# Patient Record
Sex: Male | Born: 1980 | Race: Black or African American | Hispanic: No | Marital: Married | Smoking: Current some day smoker
Health system: Southern US, Community
[De-identification: ages and names within clinical notes are randomized; demographics above are authoritative.]

## PROBLEM LIST (undated history)

## (undated) DIAGNOSIS — F329 Major depressive disorder, single episode, unspecified: Secondary | ICD-10-CM

## (undated) DIAGNOSIS — C717 Malignant neoplasm of brain stem: Secondary | ICD-10-CM

## (undated) DIAGNOSIS — F32A Depression, unspecified: Secondary | ICD-10-CM

## (undated) DIAGNOSIS — W3400XA Accidental discharge from unspecified firearms or gun, initial encounter: Secondary | ICD-10-CM

## (undated) HISTORY — PX: COLON SURGERY: SHX602

---
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
## 2000-01-05 ENCOUNTER — Inpatient Hospital Stay (HOSPITAL_COMMUNITY): Admission: EM | Admit: 2000-01-05 | Discharge: 2000-01-09 | Payer: Self-pay | Admitting: *Deleted

## 2000-09-03 ENCOUNTER — Emergency Department (HOSPITAL_COMMUNITY): Admission: EM | Admit: 2000-09-03 | Discharge: 2000-09-03 | Payer: Self-pay | Admitting: Emergency Medicine

## 2000-09-03 ENCOUNTER — Encounter: Payer: Self-pay | Admitting: Emergency Medicine

## 2001-12-06 ENCOUNTER — Encounter (INDEPENDENT_AMBULATORY_CARE_PROVIDER_SITE_OTHER): Payer: Self-pay

## 2001-12-06 ENCOUNTER — Encounter: Payer: Self-pay | Admitting: General Surgery

## 2001-12-06 ENCOUNTER — Inpatient Hospital Stay (HOSPITAL_COMMUNITY): Admission: AC | Admit: 2001-12-06 | Discharge: 2001-12-30 | Payer: Self-pay

## 2001-12-08 ENCOUNTER — Encounter: Payer: Self-pay | Admitting: General Surgery

## 2001-12-10 ENCOUNTER — Encounter: Payer: Self-pay | Admitting: Surgery

## 2001-12-11 ENCOUNTER — Encounter: Payer: Self-pay | Admitting: General Surgery

## 2001-12-12 ENCOUNTER — Encounter: Payer: Self-pay | Admitting: General Surgery

## 2001-12-15 ENCOUNTER — Encounter: Payer: Self-pay | Admitting: General Surgery

## 2001-12-15 ENCOUNTER — Encounter: Payer: Self-pay | Admitting: Surgery

## 2001-12-18 ENCOUNTER — Encounter: Payer: Self-pay | Admitting: General Surgery

## 2001-12-23 ENCOUNTER — Encounter: Payer: Self-pay | Admitting: General Surgery

## 2001-12-26 ENCOUNTER — Encounter: Payer: Self-pay | Admitting: General Surgery

## 2002-01-04 ENCOUNTER — Encounter: Payer: Self-pay | Admitting: Emergency Medicine

## 2002-01-04 ENCOUNTER — Emergency Department (HOSPITAL_COMMUNITY): Admission: EM | Admit: 2002-01-04 | Discharge: 2002-01-05 | Payer: Self-pay | Admitting: Emergency Medicine

## 2002-01-09 ENCOUNTER — Inpatient Hospital Stay (HOSPITAL_COMMUNITY): Admission: EM | Admit: 2002-01-09 | Discharge: 2002-01-12 | Payer: Self-pay

## 2002-01-10 ENCOUNTER — Encounter: Payer: Self-pay | Admitting: Surgery

## 2002-01-11 ENCOUNTER — Encounter: Payer: Self-pay | Admitting: Surgery

## 2002-01-13 ENCOUNTER — Encounter: Payer: Self-pay | Admitting: Emergency Medicine

## 2002-01-13 ENCOUNTER — Emergency Department (HOSPITAL_COMMUNITY): Admission: EM | Admit: 2002-01-13 | Discharge: 2002-01-13 | Payer: Self-pay | Admitting: Emergency Medicine

## 2002-02-10 ENCOUNTER — Emergency Department (HOSPITAL_COMMUNITY): Admission: EM | Admit: 2002-02-10 | Discharge: 2002-02-11 | Payer: Self-pay

## 2002-02-17 ENCOUNTER — Emergency Department (HOSPITAL_COMMUNITY): Admission: EM | Admit: 2002-02-17 | Discharge: 2002-02-17 | Payer: Self-pay | Admitting: Emergency Medicine

## 2002-04-12 ENCOUNTER — Encounter: Payer: Self-pay | Admitting: General Surgery

## 2002-04-14 ENCOUNTER — Inpatient Hospital Stay (HOSPITAL_COMMUNITY): Admission: RE | Admit: 2002-04-14 | Discharge: 2002-04-21 | Payer: Self-pay | Admitting: General Surgery

## 2002-04-14 ENCOUNTER — Encounter (INDEPENDENT_AMBULATORY_CARE_PROVIDER_SITE_OTHER): Payer: Self-pay | Admitting: *Deleted

## 2002-04-23 ENCOUNTER — Emergency Department (HOSPITAL_COMMUNITY): Admission: EM | Admit: 2002-04-23 | Discharge: 2002-04-23 | Payer: Self-pay | Admitting: Emergency Medicine
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
## 2004-03-18 ENCOUNTER — Emergency Department (HOSPITAL_COMMUNITY): Admission: EM | Admit: 2004-03-18 | Discharge: 2004-03-18 | Payer: Self-pay

## 2004-10-06 ENCOUNTER — Emergency Department (HOSPITAL_COMMUNITY): Admission: EM | Admit: 2004-10-06 | Discharge: 2004-10-07 | Payer: Self-pay | Admitting: *Deleted

## 2005-04-02 ENCOUNTER — Emergency Department (HOSPITAL_COMMUNITY): Admission: EM | Admit: 2005-04-02 | Discharge: 2005-04-02 | Payer: Self-pay | Admitting: Emergency Medicine

## 2005-07-28 ENCOUNTER — Emergency Department (HOSPITAL_COMMUNITY): Admission: EM | Admit: 2005-07-28 | Discharge: 2005-07-28 | Payer: Self-pay | Admitting: Emergency Medicine

## 2005-07-29 ENCOUNTER — Emergency Department (HOSPITAL_COMMUNITY): Admission: EM | Admit: 2005-07-29 | Discharge: 2005-07-29 | Payer: Self-pay | Admitting: Emergency Medicine

## 2006-07-01 ENCOUNTER — Emergency Department (HOSPITAL_COMMUNITY): Admission: EM | Admit: 2006-07-01 | Discharge: 2006-07-01 | Payer: Self-pay | Admitting: Emergency Medicine

## 2008-01-17 ENCOUNTER — Emergency Department (HOSPITAL_COMMUNITY): Admission: EM | Admit: 2008-01-17 | Discharge: 2008-01-18 | Payer: Self-pay | Admitting: Emergency Medicine

## 2008-01-18 ENCOUNTER — Inpatient Hospital Stay (HOSPITAL_COMMUNITY): Admission: EM | Admit: 2008-01-18 | Discharge: 2008-01-19 | Payer: Self-pay | Admitting: Psychiatry

## 2008-01-23 ENCOUNTER — Ambulatory Visit: Payer: Self-pay | Admitting: Psychiatry

## 2008-04-03 ENCOUNTER — Emergency Department (HOSPITAL_COMMUNITY): Admission: EM | Admit: 2008-04-03 | Discharge: 2008-04-03 | Payer: Self-pay | Admitting: Emergency Medicine

## 2009-02-20 ENCOUNTER — Emergency Department (HOSPITAL_COMMUNITY): Admission: EM | Admit: 2009-02-20 | Discharge: 2009-02-20 | Payer: Self-pay | Admitting: Emergency Medicine
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
## 2011-03-13 NOTE — Op Note (Signed)
[name]. [facility]  Patient:    Jorge Henderson, Jorge Henderson Visit Number: [id number] MRN: [id number]          Service Type: SUR Location: 5700 5703 01 Attending Physician:  Cherylynn Ridges Dictated by:   Jimmye Norman, M.D. Proc. Date: 04/15/02 Admit Date:  04/14/2002                             Operative Report  PREOPERATIVE DIAGNOSIS:  Hemorrhage from colostomy site.  POSTOPERATIVE DIAGNOSIS:  Hemorrhage from colostomy site.  OPERATION PERFORMED:  Re-exploration and re-excision of colostomy site with repair and control of hemorrhage.  SURGEON:  Jimmye Norman, M.D.  ASSISTANT:  None.  ANESTHESIA:  General endotracheal.  ESTIMATED BLOOD LOSS:  Less than 10 to 20 cc.  COMPLICATIONS:  None.  CONDITION:  Stable.  INDICATIONS FOR PROCEDURE:  The patient is a 30 year old male who underwent a take-down colostomy yesterday who had continued to bleed large amounts of clotted blood from his colostomy site.  Because of continued hemorrhage, he was taken to the operating room for evacuation, repair and expression of wound.  OPERATIVE FINDINGS:  The patient had diffuse oozing throughout the colostomy site; however, it was noted that the skin edges were not well apposed and there was bleeding from the edges.  There was no definitive arterial bleeder from this site at all.  DESCRIPTION OF PROCEDURE:  The patient was taken to the operating room and placed on the table on the supine position.  After an adequate endotracheal anesthetic was administered, he was prepped and draped in the usual sterile manner exposing the midline.  The colostomy site was the one that was bleeding.  The staples were removed using a hemostat clamp exposing the subcutaneous which had a large amount of clot within it.  The clot was evacuated.  The wound was irrigated and multiple sort of diffuse sites of bleeding were noted which were cauterized.  No arterial bleeding was noted  initially.  We re-excised the wound making an oval incision around the previous colostomy site which was circular, elongating it approximately 50%.  We subsequently controlled the bleeding in the subcutaneous with electrocautery.  We then closed in two layers.  We did reinforce the fascial layer with three interrupted figure-of-eight stitches of 4-0 Vicryl.  The subcutaneous was then reapproximated using a running 2-0 undyed Vicryl suture.  The skin was closed using stainless steel staples.  There was minimal bleeding during this point and we closed with stainless steel staples on the skin. Dictated by:   Jimmye Norman, M.D. Attending Physician:  Cherylynn Ridges DD:  04/15/02 TD:  04/17/02 Job: [id number]
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
## 2011-07-20 LAB — BASIC METABOLIC PANEL
BUN: 14
Calcium: 9.5
Creatinine, Ser: 1.01
GFR calc non Af Amer: >60
Potassium: 3.4 — ABNORMAL LOW

## 2011-07-20 LAB — URINALYSIS, ROUTINE W REFLEX MICROSCOPIC
Ketones, ur: NEGATIVE
Nitrite: NEGATIVE
Protein, ur: NEGATIVE
Specific Gravity, Urine: 1.023

## 2011-07-20 LAB — HEPATIC FUNCTION PANEL
ALT: 13
ALT: 15
Alkaline Phosphatase: 114
Alkaline Phosphatase: 119 — ABNORMAL HIGH
Bilirubin, Direct: <0.1
Indirect Bilirubin: 0.6
Total Bilirubin: 0.6
Total Bilirubin: 0.7
Total Protein: 6.3

## 2011-07-20 LAB — RAPID URINE DRUG SCREEN, HOSP PERFORMED
Amphetamines: NOT DETECTED
Barbiturates: NOT DETECTED
Benzodiazepines: NOT DETECTED
Cocaine: NOT DETECTED
Opiates: NOT DETECTED

## 2011-07-20 LAB — ETHANOL: Alcohol, Ethyl (B): <5

## 2011-07-20 LAB — CBC
HCT: 43.3
MCV: 93.1
Platelets: 201
RBC: 4.65
WBC: 5.9

## 2011-07-20 LAB — DIFFERENTIAL
Eosinophils Absolute: 0
Neutrophils Relative %: 39 — ABNORMAL LOW

## 2011-07-20 LAB — ACETAMINOPHEN LEVEL
Acetaminophen (Tylenol), Serum: 13.2
Acetaminophen (Tylenol), Serum: 34 — ABNORMAL HIGH

## 2011-07-20 LAB — SALICYLATE LEVEL: Salicylate Lvl: 14.8

## 2011-07-20 LAB — TSH: TSH: 0.901

## 2011-07-23 LAB — POCT I-STAT, CHEM 8
Chloride: 106
Glucose, Bld: 86
HCT: 43
Potassium: 3.3 — ABNORMAL LOW

## 2013-07-06 ENCOUNTER — Emergency Department (HOSPITAL_COMMUNITY)
Admission: EM | Admit: 2013-07-06 | Discharge: 2013-07-06 | Disposition: A | Discharge status: Home / Self Care | Payer: Self-pay | Attending: Emergency Medicine | Admitting: Emergency Medicine

## 2013-07-06 ENCOUNTER — Emergency Department (HOSPITAL_COMMUNITY): Payer: Self-pay

## 2013-07-06 ENCOUNTER — Encounter (HOSPITAL_COMMUNITY): Payer: Self-pay | Admitting: Emergency Medicine

## 2013-07-06 DIAGNOSIS — R209 Unspecified disturbances of skin sensation: Secondary | ICD-10-CM | POA: Insufficient documentation

## 2013-07-06 DIAGNOSIS — Y9383 Activity, rough housing and horseplay: Secondary | ICD-10-CM | POA: Insufficient documentation

## 2013-07-06 DIAGNOSIS — F17200 Nicotine dependence, unspecified, uncomplicated: Secondary | ICD-10-CM | POA: Insufficient documentation

## 2013-07-06 DIAGNOSIS — Z85841 Personal history of malignant neoplasm of brain: Secondary | ICD-10-CM | POA: Insufficient documentation

## 2013-07-06 DIAGNOSIS — X500XXA Overexertion from strenuous movement or load, initial encounter: Secondary | ICD-10-CM | POA: Insufficient documentation

## 2013-07-06 DIAGNOSIS — S82899A Other fracture of unspecified lower leg, initial encounter for closed fracture: Secondary | ICD-10-CM | POA: Insufficient documentation

## 2013-07-06 DIAGNOSIS — Y9289 Other specified places as the place of occurrence of the external cause: Secondary | ICD-10-CM | POA: Insufficient documentation

## 2013-07-06 DIAGNOSIS — S82401A Unspecified fracture of shaft of right fibula, initial encounter for closed fracture: Secondary | ICD-10-CM

## 2013-07-06 HISTORY — DX: Malignant neoplasm of brain stem: C71.7

## 2013-07-06 MED ORDER — IBUPROFEN 800 MG PO TABS
800.0000 mg | ORAL_TABLET | Freq: Once | ORAL | Status: AC
  Administered 2013-07-06: 800 mg via ORAL
  Filled 2013-07-06: qty 1

## 2013-07-06 NOTE — Progress Notes (Signed)
[facility] provided pt with a [facility] orange card application.

## 2013-07-06 NOTE — ED Notes (Signed)
Per pt, states he twisted ankle last Monday-still having pain
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
## 2013-07-09 NOTE — ED Provider Notes (Signed)
Medical screening examination/treatment/procedure(s) were performed by non-physician practitioner and as supervising physician I was immediately available for consultation/collaboration.  Layla Maw Jadier Rockers, DO 07/09/13 1400

## 2015-09-12 ENCOUNTER — Emergency Department (HOSPITAL_COMMUNITY): Payer: Self-pay

## 2015-09-12 ENCOUNTER — Encounter (HOSPITAL_COMMUNITY): Payer: Self-pay | Admitting: *Deleted

## 2015-09-12 ENCOUNTER — Emergency Department (HOSPITAL_COMMUNITY)
Admission: EM | Admit: 2015-09-12 | Discharge: 2015-09-13 | Disposition: A | Discharge status: Other Acute Inpatient Hospital | Payer: Self-pay | Attending: Emergency Medicine | Admitting: Emergency Medicine

## 2015-09-12 DIAGNOSIS — F1110 Opioid abuse, uncomplicated: Secondary | ICD-10-CM | POA: Insufficient documentation

## 2015-09-12 DIAGNOSIS — Y998 Other external cause status: Secondary | ICD-10-CM | POA: Insufficient documentation

## 2015-09-12 DIAGNOSIS — F332 Major depressive disorder, recurrent severe without psychotic features: Secondary | ICD-10-CM | POA: Diagnosis present

## 2015-09-12 DIAGNOSIS — F17210 Nicotine dependence, cigarettes, uncomplicated: Secondary | ICD-10-CM | POA: Insufficient documentation

## 2015-09-12 DIAGNOSIS — Z85841 Personal history of malignant neoplasm of brain: Secondary | ICD-10-CM | POA: Insufficient documentation

## 2015-09-12 DIAGNOSIS — R0682 Tachypnea, not elsewhere classified: Secondary | ICD-10-CM | POA: Insufficient documentation

## 2015-09-12 DIAGNOSIS — Y9389 Activity, other specified: Secondary | ICD-10-CM | POA: Insufficient documentation

## 2015-09-12 DIAGNOSIS — T402X2A Poisoning by other opioids, intentional self-harm, initial encounter: Secondary | ICD-10-CM | POA: Insufficient documentation

## 2015-09-12 DIAGNOSIS — T391X2A Poisoning by 4-Aminophenol derivatives, intentional self-harm, initial encounter: Secondary | ICD-10-CM | POA: Insufficient documentation

## 2015-09-12 DIAGNOSIS — Y9289 Other specified places as the place of occurrence of the external cause: Secondary | ICD-10-CM | POA: Insufficient documentation

## 2015-09-12 DIAGNOSIS — F1210 Cannabis abuse, uncomplicated: Secondary | ICD-10-CM | POA: Insufficient documentation

## 2015-09-12 HISTORY — DX: Depression, unspecified: F32.A

## 2015-09-12 HISTORY — DX: Major depressive disorder, single episode, unspecified: F32.9

## 2015-09-12 LAB — RAPID URINE DRUG SCREEN, HOSP PERFORMED
Amphetamines: NOT DETECTED
BARBITURATES: NOT DETECTED
Benzodiazepines: NOT DETECTED
COCAINE: NOT DETECTED
OPIATES: POSITIVE — AB
Tetrahydrocannabinol: POSITIVE — AB

## 2015-09-12 LAB — ETHANOL: Alcohol, Ethyl (B): <5 mg/dL (ref <5)

## 2015-09-12 LAB — CBC WITH DIFFERENTIAL/PLATELET
BASOS PCT: 0 %
Basophils Absolute: 0 10*3/uL (ref 0.0–0.1)
EOS ABS: 0 10*3/uL (ref 0.0–0.7)
EOS PCT: 0 %
HCT: 43.2 % (ref 39.0–52.0)
Hemoglobin: 15.2 g/dL (ref 13.0–17.0)
Lymphocytes Relative: 33 %
Lymphs Abs: 2.7 10*3/uL (ref 0.7–4.0)
MCH: 32.1 pg (ref 26.0–34.0)
MCHC: 35.2 g/dL (ref 30.0–36.0)
MCV: 91.3 fL (ref 78.0–100.0)
MONO ABS: 0.4 10*3/uL (ref 0.1–1.0)
MONOS PCT: 5 %
Neutro Abs: 4.9 10*3/uL (ref 1.7–7.7)
Neutrophils Relative %: 62 %
PLATELETS: 200 10*3/uL (ref 150–400)
RBC: 4.73 MIL/uL (ref 4.22–5.81)
RDW: 12.3 % (ref 11.5–15.5)
WBC: 8.1 10*3/uL (ref 4.0–10.5)

## 2015-09-12 LAB — COMPREHENSIVE METABOLIC PANEL
ALBUMIN: 4.8 g/dL (ref 3.5–5.0)
ALK PHOS: 116 U/L (ref 38–126)
ALT: 20 U/L (ref 17–63)
AST: 22 U/L (ref 15–41)
Anion gap: 13 (ref 5–15)
BILIRUBIN TOTAL: 0.5 mg/dL (ref 0.3–1.2)
BUN: 17 mg/dL (ref 6–20)
CALCIUM: 9.2 mg/dL (ref 8.9–10.3)
CO2: 20 mmol/L — ABNORMAL LOW (ref 22–32)
Chloride: 105 mmol/L (ref 101–111)
Creatinine, Ser: 1.02 mg/dL (ref 0.61–1.24)
GFR calc Af Amer: >60 mL/min (ref >60)
GFR calc non Af Amer: >60 mL/min (ref >60)
GLUCOSE: 130 mg/dL — AB (ref 65–99)
Potassium: 3.4 mmol/L — ABNORMAL LOW (ref 3.5–5.1)
Sodium: 138 mmol/L (ref 135–145)
TOTAL PROTEIN: 7.2 g/dL (ref 6.5–8.1)

## 2015-09-12 LAB — I-STAT CHEM 8, ED
BUN: 16 mg/dL (ref 6–20)
CHLORIDE: 102 mmol/L (ref 101–111)
CREATININE: 1 mg/dL (ref 0.61–1.24)
Calcium, Ion: 1.16 mmol/L (ref 1.12–1.23)
GLUCOSE: 132 mg/dL — AB (ref 65–99)
HCT: 48 % (ref 39.0–52.0)
Hemoglobin: 16.3 g/dL (ref 13.0–17.0)
POTASSIUM: 3.4 mmol/L — AB (ref 3.5–5.1)
Sodium: 140 mmol/L (ref 135–145)
TCO2: 21 mmol/L (ref 0–100)

## 2015-09-12 LAB — ACETAMINOPHEN LEVEL
ACETAMINOPHEN (TYLENOL), SERUM: 27 ug/mL (ref 10–30)
Acetaminophen (Tylenol), Serum: 11 ug/mL (ref 10–30)

## 2015-09-12 LAB — SALICYLATE LEVEL: Salicylate Lvl: <4 mg/dL (ref 2.8–30.0)

## 2015-09-12 LAB — CBG MONITORING, ED: Glucose-Capillary: 127 mg/dL — ABNORMAL HIGH (ref 65–99)
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
## 2015-09-12 NOTE — ED Notes (Signed)
Poison control called, spoke with Corene Cornea, draw a tylenol level at 1630. Symptomatic and supportive care.  Fluids and antiemetics supportively if needed. Called poison control is 4 hour post tylenol level is >140mcg/ml.

## 2015-09-12 NOTE — ED Provider Notes (Signed)
Patient is medically clear. Second Tylenol level was 11. TTS consultation  Blanchie Dessert, MD 09/12/15 [phone]

## 2015-09-12 NOTE — ED Notes (Addendum)
Pt reports he has an allergy to tylenol,  pt has been intubated in the past due to tylenol allergy. Pt currently has brain cancer, received radiation last week. Pt has had SI thoughts x1 month. Pt took at least 30 tablets of vicodin (wifes medication) in attempt to kill himself. Pt took medications around 1230, then drove to pick up his wife. Wife found him and called ems.  Ems started 18 G L AC, 4 mg zofran given.   At present pt reports his throat feels scratchy and he is nauseas with abdominal pain 8/10. Pt vomited 71ml upon arrival to ED.   Pt has previous SI attempt a few years ago by cutting himself.   Pt and wife both separately report he receives radiation from [facility] on [address].
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
## 2015-09-12 NOTE — ED Notes (Addendum)
Pt presents with SI attempt, overdose of 30 Vicodin tabs.  Pt cleared by [employer].  AAO x 3, no distress noted at present.  Family at bedside at present. Monitoring for safety, Q 15 min checks in effect.  Denies HI or AV hallucinations, feeling hopeless.

## 2015-09-12 NOTE — Progress Notes (Signed)
CM spoke with pt who confirms uninsured [place] resident with no pcp.  CM discussed and provided written information for uninsured accepting pcps, discussed the importance of pcp vs EDP services for f/u care, www.needymeds.org, www.goodrx.com, discounted pharmacies and other [place] resources such as [facility] , [facility], affordable care act, financial assistance, uninsured dental services, [place] med assist, DSS and  health department  Reviewed resources for [place] uninsured accepting pcps like [facility], [facility] [address], [facility], [facility], local urgent care centers, [facility], [facility], general medical clinics, [facility], [facility] plus others, medication resources, [facility] out patient [facility]ies and housing Pt voiced understanding and appreciation of resources provided   Provided [facility] contact information Pt agreed to a referral Cm completed referral Pt to be contact by [facility] clinical liason  

## 2015-09-12 NOTE — ED Notes (Signed)
Sitter at bedside.

## 2015-09-12 NOTE — ED Notes (Signed)
Pt c/o chest pain discomfort increasing w/ inspiration.  Mesner MD made aware.

## 2015-09-12 NOTE — ED Notes (Signed)
md at bedside

## 2015-09-12 NOTE — ED Notes (Signed)
Bed: RESB Expected date:  Expected time:  Means of arrival:  Comments: Brain cancer, OD on 30 hydrocodone
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
## 2015-09-12 NOTE — ED Notes (Addendum)
Pt is medically cleared. Pt upset that he has to be changed into paper scrubs and his belongings taken away. Pt stated the "doctor did not talk about this when he first came in". rn explained that pt is medically cleared and now a behavioral patient and this is the protocol. And that the doctor did say "cooperate with [place] or you will be involuntarily committed".   Pt reports he gets radiation from [facility] on [facility] and Dr Elba Barman is his doctor. RN asked about radiation location again, and pt and wife both stated this again. 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 Pt reported this history in previous visit to ED in 2014. Pt stated "I don't want to talk about my cancer stuff".   Pt stated that he took 30 hydrocodone/vicodin, rn explained that tylenol level was not elevated. pt stated he did not take tylenol, rn explained that vicodin has "tylenol/acetometophin" in it, and that poison control stated earlier that the "expected response if the patient took 30 tablets of vicodin would be that the pt would have anaphylaxis or be a coma".  Pt alert and oriented x4.   Per plunkett, if pt tries to leave he will be IVC.

## 2015-09-13 ENCOUNTER — Inpatient Hospital Stay (HOSPITAL_COMMUNITY)
Admission: AD | Admit: 2015-09-13 | Discharge: 2015-09-16 | DRG: 882 | Disposition: A | Discharge status: Home / Self Care | Payer: Federal, State, Local not specified - Other | Source: Intra-hospital | Attending: Psychiatry | Admitting: Psychiatry

## 2015-09-13 ENCOUNTER — Encounter (HOSPITAL_COMMUNITY): Payer: Self-pay | Admitting: *Deleted

## 2015-09-13 DIAGNOSIS — F4323 Adjustment disorder with mixed anxiety and depressed mood: Principal | ICD-10-CM | POA: Clinically undetermined

## 2015-09-13 DIAGNOSIS — Z85841 Personal history of malignant neoplasm of brain: Secondary | ICD-10-CM

## 2015-09-13 DIAGNOSIS — T1491 Suicide attempt: Secondary | ICD-10-CM | POA: Diagnosis not present

## 2015-09-13 DIAGNOSIS — T50901A Poisoning by unspecified drugs, medicaments and biological substances, accidental (unintentional), initial encounter: Secondary | ICD-10-CM

## 2015-09-13 DIAGNOSIS — R45851 Suicidal ideations: Secondary | ICD-10-CM | POA: Diagnosis present

## 2015-09-13 DIAGNOSIS — F332 Major depressive disorder, recurrent severe without psychotic features: Secondary | ICD-10-CM | POA: Diagnosis present

## 2015-09-13 DIAGNOSIS — T391X2A Poisoning by 4-Aminophenol derivatives, intentional self-harm, initial encounter: Secondary | ICD-10-CM

## 2015-09-13 DIAGNOSIS — F4310 Post-traumatic stress disorder, unspecified: Secondary | ICD-10-CM | POA: Diagnosis present

## 2015-09-13 DIAGNOSIS — F17210 Nicotine dependence, cigarettes, uncomplicated: Secondary | ICD-10-CM | POA: Diagnosis present

## 2015-09-13 MED ORDER — ZOLPIDEM TARTRATE 5 MG PO TABS
5.0000 mg | ORAL_TABLET | Freq: Every evening | ORAL | Status: DC | PRN
  Administered 2015-09-13 – 2015-09-15 (×3): 5 mg via ORAL
  Filled 2015-09-13 (×3): qty 1

## 2015-09-13 MED ORDER — MAGNESIUM HYDROXIDE 400 MG/5ML PO SUSP: 30.0000 mL | Freq: Every day | ORAL | Status: DC | PRN

## 2015-09-13 MED ORDER — HYDROXYZINE HCL 25 MG PO TABS: 25.0000 mg | ORAL_TABLET | Freq: Three times a day (TID) | ORAL | Status: DC | PRN

## 2015-09-13 MED ORDER — ALUM & MAG HYDROXIDE-SIMETH 200-200-20 MG/5ML PO SUSP
30.0000 mL | ORAL | Status: DC | PRN
Start: 2015-09-13 — End: 2015-09-16

## 2015-09-13 NOTE — Tx Team (Signed)
Initial Interdisciplinary Treatment Plan   PATIENT STRESSORS: Financial difficulties Occupational concerns Substance abuse   PATIENT STRENGTHS: Ability for insight Average or above average intelligence Capable of independent living Communication skills Motivation for treatment/growth Supportive family/friends   PROBLEM LIST: Problem List/Patient Goals Date to be addressed Date deferred Reason deferred Estimated date of resolution  "I want to feel better about myself again, open up & be  Confident". 09-13-15     Alteration in mood (Depression & Anxiety) 09-13-15     Substance Abuse (THC & Opiods) 09-13-15                                          DISCHARGE CRITERIA:  Improved stabilization in mood, thinking, and/or behavior Motivation to continue treatment in a less acute level of care Reduction of life-threatening or endangering symptoms to within safe limits Verbal commitment to aftercare and medication compliance Withdrawal symptoms are absent or subacute and managed without 24-hour nursing intervention  PRELIMINARY DISCHARGE PLAN: Outpatient therapy Return to previous living arrangement  PATIENT/FAMIILY INVOLVEMENT: This treatment plan has been presented to and reviewed with the patient, Jorge Henderson.  The patient have been given the opportunity to ask questions and make suggestions.  Keane Police 09/13/2015, 2:42 PM

## 2015-09-13 NOTE — BH Assessment (Signed)
[facility] Assessment Progress Note  Per Corena Pilgrim, MD, this pt requires psychiatric hospitalization at this time.  Per shift report pt has been assigned pt to Rm [id number].  Pt has signed Voluntary Admission and Consent for Treatment, as well as Consent to Release Information to his wife and his mother, and signed forms have been faxed to [facility].  Pt's nurse, Otho Perl, has been notified, and agrees to send original paperwork along with pt via [facility], and to call report to [phone].  Jalene Mullet, [place] Triage Specialist [phone]
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
## 2015-09-13 NOTE — [facility] Group Notes (Signed)
Adult Psychoeducational Group Note  Date:  09/13/2015 Time:  9:53 PM  Group Topic/Focus:  Wrap-Up Group:   The focus of this group is to help patients review their daily goal of treatment and discuss progress on daily workbooks.  Participation Level:  Active  Participation Quality:  Appropriate  Affect:  Appropriate  Cognitive:  Appropriate  Insight: Good  Engagement in Group:  Engaged  Modes of Intervention:  Discussion  Additional Comments:  Patient is a new admit.  He stated he wants to go home and that the day has been pretty good.  He also expressed that he likes the people and was glad to meet others who were going through the same things he's going through and he was glad to be around others in general.  Jorge Henderson A 09/13/2015, 9:53 PM

## 2015-09-13 NOTE — ED Notes (Signed)
Informed extender that patient and family are requesting a provider consult.
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
## 2015-09-13 NOTE — BHH Suicide Risk Assessment (Signed)
[facility] Admission Suicide Risk Assessment   Nursing information obtained from:   patient and chart  Demographic factors:   34 year old married male, lives with wife  Current Mental Status:   see below Loss Factors:   marital distress, history of cancer, unemployment  Historical Factors:   history of PTSD in the past, now improved Risk Reduction Factors:   resilience, social support  Total Time spent with patient: 45 minutes Principal Problem: Suicidal attempt by overdose Diagnosis:   Patient Active Problem List   Diagnosis Date Noted  . Recurrent major depression-severe ([place]) [F33.2] 09/13/2015  . Major depressive disorder, recurrent episode, severe ([place]) [F33.2] 09/13/2015     Continued Clinical Symptoms:  Alcohol Use Disorder Identification Test Final Score (AUDIT): 2 The "Alcohol Use Disorders Identification Test", Guidelines for Use in Primary Care, Second Edition.  World Pharmacologist [facility]). Score between 0-7:  no or low risk or alcohol related problems. Score between 8-15:  moderate risk of alcohol related problems. Score between 16-19:  high risk of alcohol related problems. Score 20 or above:  warrants further diagnostic evaluation for alcohol dependence and treatment.   CLINICAL FACTORS:  34 year old married male, reports impulsive overdose on vicodin ( complicated by fact that he is allergic to acetaminophen ) yesterday, related to distress in telling his wife he had been unfaithful. Minimizes / denies depression prior to his overdose. Today denies depression , neurovegetative symptoms of depression or any lingering suicidal ideations.    Psychiatric Specialty Exam: Physical Exam  ROS  Blood pressure 140/70, pulse 91, temperature 98.3 F (36.8 C), temperature source Oral, resp. rate 16, height 5\' 9"  (1.753 m), weight 174 lb (78.926 kg), SpO2 100 %.Body mass index is 25.68 kg/(m^2).   see admit note MSE   COGNITIVE FEATURES THAT CONTRIBUTE TO RISK:  Loss of  executive function    SUICIDE RISK:   Moderate:  Frequent suicidal ideation with limited intensity, and duration, some specificity in terms of plans, no associated intent, good self-control, limited dysphoria/symptomatology, some risk factors present, and identifiable protective factors, including available and accessible social support.  PLAN OF CARE: Patient will be admitted to inpatient psychiatric unit for stabilization and safety. Will provide and encourage milieu participation. Provide medication management and maked adjustments as needed.  Will follow daily.    Medical Decision Making:  Established Problem, Stable/Improving (1), Review of Psycho-Social Stressors (1), Review or order clinical lab tests (1) and Review of Medication Regimen & Side Effects (2)  I certify that inpatient services furnished can reasonably be expected to improve the patient's condition.   COBOS, [place] 09/13/2015, 5:17 PM

## 2015-09-14 LAB — TSH: TSH: 0.733 u[IU]/mL (ref 0.350–4.500)

## 2015-09-14 NOTE — Progress Notes (Signed)
Adult Psychoeducational Group Note  Date:  09/14/2015 Time:  1045  Group Topic/Focus:  Making Healthy Choices:   The focus of this group is to help patients identify negative/unhealthy choices they were using prior to admission and identify positive/healthier coping strategies to replace them upon discharge.  Participation Level:  Active  Participation Quality:  Appropriate  Affect:  Appropriate  Cognitive:  Appropriate  Insight: Appropriate  Engagement in Group:  Engaged  Modes of Intervention:  Discussion and Education  Additional Comments:    Alfred Harrel L 09/14/2015, 1:21 PM
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
## 2015-09-14 NOTE — BHH Group Notes (Signed)
[place] Group Notes:  (Clinical Social Work)  09/14/2015     1:15-2:15PM  Summary of Progress/Problems:   The main focus of today's process group was to learn how to use a decisional balance exercise to move forward in the Stages of Change, which were described and discussed.  Patients listed needs on the whiteboard and unhealthy coping techniques often used to fill needs.  Motivational Interviewing and the whiteboard were utilized to help patients explore in depth the perceived benefits and costs of unhealthy coping techniques, as well as the  benefits and costs of replacing that with a healthy coping skills.  A handout was distributed for patients to be able to do this exercise for themselves.   The patient expressed that their own unhealthy coping involves not taking time to release his own pain, not allowing himself to be weak, isolating, acting fake to please others, tormenting himself, and avoidance.  He stated that he likes to make people laugh and that makes him feel good.  He slept through much of group and when he awoke, he monopolized while talking about all that he does not agree with in the mental health system.  Type of Therapy:  Group Therapy - Process   Participation Level:  Active  Participation Quality:  Attentive, Drowsy and Monopolizing  Affect:  Blunted  Cognitive:  Alert  Insight:  Improving and Limited  Engagement in Therapy:  Improving  Modes of Intervention:  Education, Motivational Interviewing  Selmer Dominion, LCSW 09/14/2015, 4:19 PM

## 2015-09-14 NOTE — Progress Notes (Signed)
Adult Psychoeducational Group Note  Date:  09/14/2015 Time:  9:29 PM  Group Topic/Focus:  Wrap-Up Group:   The focus of this group is to help patients review their daily goal of treatment and discuss progress on daily workbooks.  Participation Level:  Active  Participation Quality:    Affect:  Appropriate  Cognitive:  Alert and Appropriate  Insight: Appropriate  Engagement in Group:  Engaged  Modes of Intervention:  Discussion  Additional Comments:  Pt stated that he had a good day. He is looking forward to going home soon. His goal is to learn how to talk before he reacts.   Wynelle Fanny R 09/14/2015, 9:29 PM
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
## 2015-09-14 NOTE — Progress Notes (Signed)
  D: Pt was pleasant and insightful. Stated he initially believed that he was going thru everything along, but realized while at the hosp that he had a lot support. Stated, "If I need to stay, I will. If he thinks I'm ready to leave, I'll leave". Pt states he wants to live. Pt has no other questions or concerns.   A:  Support and encouragement was offered. 15 min checks continued for safety.  R: Pt remains safe.

## 2015-09-15 NOTE — BHH Group Notes (Signed)
[place] Group Notes:  (Clinical Social Work)  09/15/2015  1:15-2:15pm  Summary of Progress/Problems:   The main focus of today's process group was to   1)  discuss the importance of adding supports  2)  define health supports versus unhealthy supports  3)  identify the patient's current unhealthy supports and plan how to handle them  4)  Identify the patient's current healthy supports and plan what to add.  An emphasis was placed on using counselor, doctor, therapy groups, 12-step groups, and problem-specific support groups to expand supports.    The patient expressed full comprehension of the concepts presented, and agreed that there is a need to add more supports.  The patient continued to monopolize group often by talking about his own situation, or providing specific advice to one patient, but was redirectable.  Type of Therapy:  Process Group with Motivational Interviewing  Participation Level:  Active  Participation Quality:  Monopolizing, Redirectable and Supportive  Affect:  Blunted and Irritable  Cognitive:  Appropriate and Oriented  Insight:  Developing/Improving  Engagement in Therapy:  Engaged  Modes of Intervention:   Education, Support and Processing, Activity  Selmer Dominion, LCSW 09/15/2015

## 2015-09-15 NOTE — Progress Notes (Signed)
D. Pt had been up and visible in milieu this evening, did attend and participate in evening group activity. Pt seen interacting appropriately with peers. Pt did speak about how he is hopeful for discharge in the morning and did receive medication for sleep without incident. A. Support and encouragement provided. R. Safety maintained, will continue to monitor.

## 2015-09-15 NOTE — Progress Notes (Signed)
D: Pt insightful on approach during shift assessment. Pt stated that he has worked things out with his wife and plans to return home. Pt stated that he's learned a lot just from being here and able to relate to other pt with depression. Pt stated that he's able to identify with some of the pts here dealing with depression. Moving forward, pt stated that he will use his coping skills and talk to his family during stressful time. Pt denies suicidal thoughts. Pt denies depression. Pt reports fair sleep.  A:MAR reviewed. Verbal support given. Pt encouraged to attend groups. 15 minute checks performed for safety.  R: Pt receptive to tx.

## 2015-09-15 NOTE — Progress Notes (Signed)
D. Pt had been up and visible in milieu this evening, did attend and participate in evening group activity. Pt spoke of his day and spoke about how he is likely to be discharged tomorrow. Pt seen smiling and interacting appropriately with peers. Pt did not verbalize any complaints of pain. A. Support and encouragement provided. R. Safety maintained, will continue to monitor.
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
## 2015-09-15 NOTE — Progress Notes (Signed)
Adult Psychoeducational Group Note  Date:  09/15/2015 Time:  8:25 PM  Group Topic/Focus:  Wrap-Up Group:   The focus of this group is to help patients review their daily goal of treatment and discuss progress on daily workbooks.  Participation Level:  Active  Participation Quality:  Appropriate  Affect:  Appropriate  Cognitive:  Appropriate  Insight: Appropriate  Engagement in Group:  Engaged  Modes of Intervention:  Discussion  Additional Comments:  Pt rated overall day an 8 out of 10 although his day started off bad because he learned that he was not leaving today after being told that he would be. Pt reported that his goal for the day was to go home, which did not get achieved. Pt reported that the [employer] winning was his highlight of the day.  Lincoln Brigham 09/15/2015, 8:48 PM

## 2015-09-15 NOTE — Progress Notes (Signed)
Adult Psychoeducational Group Note  Date:  09/15/2015 Time:  1045  Group Topic/Focus:  Making Healthy Choices:   The focus of this group is to help patients identify negative/unhealthy choices they were using prior to admission and identify positive/healthier coping strategies to replace them upon discharge.  Participation Level:  Active  Participation Quality:  Appropriate  Affect:  Anxious and Irritable  Cognitive:  Appropriate  Insight: Appropriate  Engagement in Group:  Lacking  Modes of Intervention:  Discussion and Education  Additional Comments:    Izabell Schalk L 09/15/2015, 1:26 PM

## 2015-09-16 DIAGNOSIS — F4323 Adjustment disorder with mixed anxiety and depressed mood: Secondary | ICD-10-CM | POA: Clinically undetermined

## 2015-09-16 LAB — HEMOGLOBIN A1C
HEMOGLOBIN A1C: 5.8 % — AB (ref 4.8–5.6)
MEAN PLASMA GLUCOSE: 120 mg/dL

## 2015-09-16 MED ORDER — ZOLPIDEM TARTRATE 5 MG PO TABS: 5.0000 mg | ORAL_TABLET | Freq: Every evening | ORAL | Status: DC | PRN
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
## 2015-09-16 NOTE — BHH Suicide Risk Assessment (Signed)
[place] INPATIENT:  Family/Significant Other Suicide Prevention Education  Suicide Prevention Education:  Education Completed; Randale Colton, Pt's wife [phone],  has been identified by the patient as the family member/significant other with whom the patient will be residing, and identified as the person(s) who will aid the patient in the event of a mental health crisis (suicidal ideations/suicide attempt).  With written consent from the patient, the family member/significant other has been provided the following suicide prevention education, prior to the and/or following the discharge of the patient.  The suicide prevention education provided includes the following:  Suicide risk factors  Suicide prevention and interventions  National Suicide Hotline telephone number  [facility] assessment telephone number  [facility] Emergency Assistance [place] and/or Residential Mobile Crisis Unit telephone number  Request made of family/significant other to:  Remove weapons (e.g., guns, rifles, knives), all items previously/currently identified as safety concern.    Remove drugs/medications (over-the-counter, prescriptions, illicit drugs), all items previously/currently identified as a safety concern.  The family member/significant other verbalizes understanding of the suicide prevention education information provided.  The family member/significant other agrees to remove the items of safety concern listed above.  Peri Maris M 09/16/2015, 10:14 AM

## 2015-09-16 NOTE — [facility] Group Notes (Signed)
[facility] LCSW Aftercare Discharge Planning Group Note  09/16/2015 8:45 AM  Participation Quality: Alert, Appropriate and Oriented  Mood/Affect: Appropriate; feels "Great"  Depression Rating: 0  Anxiety Rating: 0  Thoughts of Suicide: Pt denies SI/HI  Will you contract for safety? Yes  Current AVH: Pt denies  Plan for Discharge/Comments: Pt attended discharge planning group and actively participated in group. CSW discussed suicide prevention education with the group and encouraged them to discuss discharge planning and any relevant barriers. Pt reports being ready for DC and that his wife will come to pick him up. He declines referral services for therapy, reporting that he has scheduled his own.  Transportation Means: Pt reports access to transportation  Supports: No supports mentioned at this time  Peri Maris, [place] 09/16/2015 9:19 AM

## 2015-09-16 NOTE — Progress Notes (Signed)
  [facility] Adult Case Management Discharge Plan :  Will you be returning to the same living situation after discharge:  Yes,  Pt returning home with family At discharge, do you have transportation home?: Yes,  Pt wife to provide transportation Do you have the ability to pay for your medications: Pt not prescribed medications   Release of information consent forms completed and in the chart;  Patient's signature needed at discharge.  Patient to Follow up at: Follow-up Information    Follow up with Pt declines referral services; requests to schedule his own appointments.      Next level of care provider has access to [facility]  Patient denies SI/HI: Yes,  Pt denies    Safety Planning and Suicide Prevention discussed: Yes,  with wife; see SPE note for further information  Have you used any form of tobacco in the last 30 days? (Cigarettes, Smokeless Tobacco, Cigars, and/or Pipes): Yes  Has patient been referred to the Quitline?: Patient refused referral  Bo Mcclintock 09/16/2015, 10:12 AM

## 2015-09-16 NOTE — [facility] Suicide Risk Assessment (Signed)
[facility] Discharge Suicide Risk Assessment   Demographic Factors:  Male  Total Time spent with patient: 30 minutes  Musculoskeletal: Strength & Muscle Tone: within normal limits Gait & Station: normal Patient leans: N/A  Psychiatric Specialty Exam: Physical Exam  Review of Systems  Psychiatric/Behavioral: Positive for substance abuse. Negative for depression, suicidal ideas and hallucinations. The patient is not nervous/anxious.   All other systems reviewed and are negative.   Blood pressure 135/82, pulse 76, temperature 98.4 F (36.9 C), temperature source Oral, resp. rate 16, height 5\' 9"  (1.753 m), weight 78.926 kg (174 lb), SpO2 100 %.Body mass index is 25.68 kg/(m^2).  General Appearance: Casual  Eye Contact::  Good  Speech:  Clear and Coherent409  Volume:  Normal  Mood:  Euthymic  Affect:  Appropriate  Thought Process:  Coherent  Orientation:  Full (Time, Place, and Person)  Thought Content:  WDL  Suicidal Thoughts:  No  Homicidal Thoughts:  No  Memory:  Immediate;   Fair Recent;   Fair Remote;   Fair  Judgement:  Fair  Insight:  Fair  Psychomotor Activity:  Normal  Concentration:  Fair  Recall:  [employer] of Knowledge:Fair  Language: Fair  Akathisia:  No  Handed:  Right  AIMS (if indicated):     Assets:  Communication Skills Desire for Improvement  Sleep:  Number of Hours: 6.5  Cognition: WNL  ADL's:  Intact   Have you used any form of tobacco in the last 30 days? (Cigarettes, Smokeless Tobacco, Cigars, and/or Pipes): Yes  Has this patient used any form of tobacco in the last 30 days? (Cigarettes, Smokeless Tobacco, Cigars, and/or Pipes) Yes, A prescription for an FDA-approved tobacco cessation medication was offered at discharge and the patient refused  Mental Status Per Nursing Assessment::   On Admission:     Current Mental Status by Physician: pt denies SI/HI/AH/VH  Loss Factors: NA  Historical Factors: Impulsivity  Risk Reduction Factors:    Living with another person, especially a relative and Positive social support  Continued Clinical Symptoms:  Alcohol/Substance Abuse/Dependencies Previous Psychiatric Diagnoses and Treatments  Cognitive Features That Contribute To Risk:  Polarized thinking    Suicide Risk:  Minimal: No identifiable suicidal ideation.  Patients presenting with no risk factors but with morbid ruminations; may be classified as minimal risk based on the severity of the depressive symptoms  Principal Problem: Adjustment disorder with mixed anxiety and depressed mood Discharge Diagnoses:  Patient Active Problem List   Diagnosis Date Noted  . Adjustment disorder with mixed anxiety and depressed mood [F43.23] 09/16/2015  . Overdose [T50.901A] 09/13/2015      Plan Of Care/Follow-up recommendations:  Activity:  No restrictions Diet:  regular Tests:  as needed Other:  follow up with after care  Is patient on multiple antipsychotic therapies at discharge:  No   Has Patient had three or more failed trials of antipsychotic monotherapy by history:  No  Recommended Plan for Multiple Antipsychotic Therapies: NA    Yovan Leeman MD 09/16/2015, 9:26 AM

## 2015-09-16 NOTE — Tx Team (Signed)
Interdisciplinary Treatment Plan Update (Adult) Date: 09/16/2015   Date: 09/16/2015 9:51 AM  Progress in Treatment:  Attending groups: Yes  Participating in groups: Yes  Taking medication as prescribed: Yes  Tolerating medication: Yes  Family/Significant othe contact made: No, CSW attempting to make contact with wife Patient understands diagnosis: Yes Discussing patient identified problems/goals with staff: Yes  Medical problems stabilized or resolved: Yes  Denies suicidal/homicidal ideation: Yes Patient has not harmed self or Others: Yes   New problem(s) identified: None identified at this time.   Discharge Plan or Barriers: Pt will return home and requests to schedule follow-up on his own.  Additional comments: n/a   Reason for Continuation of Hospitalization:  Anxiety Depression Medication stabilization Suicidal ideation  Estimated length of stay: 0 days; Pt stable for DC today.  Review of initial/current patient goals per problem list:   1.  Goal(s): Patient will participate in aftercare plan  Met:  Yes  Target date: 3-5 days from date of admission   As evidenced by: Patient will participate within aftercare plan AEB aftercare provider and housing plan at discharge being identified.   09/16/15: Pt will return home and follow-up with a provider of his choice. Pt requests to schedule appointments on his own.  2.  Goal (s): Patient will exhibit decreased depressive symptoms and suicidal ideations.  Met:  Yes  Target date: 3-5 days from date of admission   As evidenced by: Patient will utilize self rating of depression at 3 or below and demonstrate decreased signs of depression or be deemed stable for discharge by MD.  09/16/15: Pt rates depression at 0/10; denies SI  3.  Goal(s): Patient will demonstrate decreased signs and symptoms of anxiety.  Met:  Yes  Target date: 3-5 days from date of admission   As evidenced by: Patient will utilize self rating of  anxiety at 3 or below and demonstrated decreased signs of anxiety, or be deemed stable for discharge by MD 09/16/2015: Pt rates anxiety at 0/10; observed to be interacting well with peers.   Attendees:  Patient:    Family:    Physician: Dr. Parke Poisson, MD  09/16/2015 9:51 AM  Nursing: Lars Pinks, RN Case manager  09/16/2015 9:51 AM  Clinical Social Worker Peri Maris, [place] 09/16/2015 9:51 AM  Other: Tilden Fossa, [place] 09/16/2015 9:51 AM  Clinical:  Darrol Angel, RN 09/16/2015 9:51 AM  Other: , RN Charge Nurse 09/16/2015 9:51 AM  Other:     Peri Maris, [place] Social Work [phone]

## 2015-09-16 NOTE — Progress Notes (Signed)
Discharge note: Pt received both written and verbal discharge instructions. Pt verbalized understanding of discharge instructions. No meds or f/u appt ordered for pt. Pt declined out pt tx and meds. Pt received belongings from room and locker. Pt safely left [facility].

## 2017-06-03 ENCOUNTER — Emergency Department (HOSPITAL_COMMUNITY): Payer: Self-pay

## 2017-06-03 ENCOUNTER — Encounter (HOSPITAL_COMMUNITY): Payer: Self-pay | Admitting: Emergency Medicine

## 2017-06-03 ENCOUNTER — Emergency Department (HOSPITAL_COMMUNITY)
Admission: EM | Admit: 2017-06-03 | Discharge: 2017-06-03 | Disposition: A | Discharge status: Home / Self Care | Payer: Self-pay | Attending: Emergency Medicine | Admitting: Emergency Medicine

## 2017-06-03 DIAGNOSIS — Y93H2 Activity, gardening and landscaping: Secondary | ICD-10-CM | POA: Insufficient documentation

## 2017-06-03 DIAGNOSIS — M79605 Pain in left leg: Secondary | ICD-10-CM

## 2017-06-03 DIAGNOSIS — F17210 Nicotine dependence, cigarettes, uncomplicated: Secondary | ICD-10-CM | POA: Insufficient documentation

## 2017-06-03 DIAGNOSIS — Y929 Unspecified place or not applicable: Secondary | ICD-10-CM | POA: Insufficient documentation

## 2017-06-03 DIAGNOSIS — X500XXA Overexertion from strenuous movement or load, initial encounter: Secondary | ICD-10-CM | POA: Insufficient documentation

## 2017-06-03 DIAGNOSIS — Y999 Unspecified external cause status: Secondary | ICD-10-CM | POA: Insufficient documentation

## 2017-06-03 DIAGNOSIS — M79662 Pain in left lower leg: Secondary | ICD-10-CM | POA: Insufficient documentation

## 2017-06-03 NOTE — Discharge Instructions (Signed)
Your x-rays does not show an acute broken bone. you're given crutches for comfort, and bear weight as tolerated on that leg. Continue supportive care such as icing, keeping the leg elevated.   Return for worsening symptoms,including worsening swelling, fever, escalating pain or any other symptoms concerning to you.

## 2017-06-03 NOTE — ED Notes (Signed)
Bed: WTR7 Expected date:  Expected time:  Means of arrival:  Comments: 

## 2017-06-03 NOTE — ED Triage Notes (Signed)
Pt returned from getting food in cafeteria

## 2017-06-03 NOTE — ED Triage Notes (Signed)
Pt called x 2 w/o answer.
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
## 2017-06-03 NOTE — ED Triage Notes (Signed)
Pt states that he twisted his ankle on the lawn mower yesterday and now has pain upon walking. Swelling last night but pt applied ace bandage that reduced swelling. Alert and oriented.

## 2017-06-18 ENCOUNTER — Emergency Department (HOSPITAL_COMMUNITY): Payer: Self-pay

## 2017-06-18 ENCOUNTER — Encounter (HOSPITAL_COMMUNITY): Payer: Self-pay | Admitting: Emergency Medicine

## 2017-06-18 ENCOUNTER — Emergency Department (HOSPITAL_COMMUNITY)
Admission: EM | Admit: 2017-06-18 | Discharge: 2017-06-19 | Disposition: A | Discharge status: Home / Self Care | Payer: Self-pay | Attending: Emergency Medicine | Admitting: Emergency Medicine

## 2017-06-18 DIAGNOSIS — Y929 Unspecified place or not applicable: Secondary | ICD-10-CM | POA: Insufficient documentation

## 2017-06-18 DIAGNOSIS — F17210 Nicotine dependence, cigarettes, uncomplicated: Secondary | ICD-10-CM | POA: Insufficient documentation

## 2017-06-18 DIAGNOSIS — F419 Anxiety disorder, unspecified: Secondary | ICD-10-CM | POA: Insufficient documentation

## 2017-06-18 DIAGNOSIS — W010XXA Fall on same level from slipping, tripping and stumbling without subsequent striking against object, initial encounter: Secondary | ICD-10-CM | POA: Insufficient documentation

## 2017-06-18 DIAGNOSIS — F329 Major depressive disorder, single episode, unspecified: Secondary | ICD-10-CM | POA: Insufficient documentation

## 2017-06-18 DIAGNOSIS — Y998 Other external cause status: Secondary | ICD-10-CM | POA: Insufficient documentation

## 2017-06-18 DIAGNOSIS — Y9389 Activity, other specified: Secondary | ICD-10-CM | POA: Insufficient documentation

## 2017-06-18 DIAGNOSIS — S93492A Sprain of other ligament of left ankle, initial encounter: Secondary | ICD-10-CM | POA: Insufficient documentation

## 2017-06-18 HISTORY — DX: Accidental discharge from unspecified firearms or gun, initial encounter: W34.00XA

## 2017-06-18 MED ORDER — IBUPROFEN 400 MG PO TABS
400.0000 mg | ORAL_TABLET | Freq: Once | ORAL | Status: AC | PRN
  Administered 2017-06-18: 400 mg via ORAL

## 2017-06-18 MED ORDER — IBUPROFEN 400 MG PO TABS
ORAL_TABLET | ORAL | Status: AC
  Filled 2017-06-18: qty 1

## 2017-06-18 NOTE — ED Notes (Signed)
Xray notified of patient rooming

## 2017-06-18 NOTE — ED Triage Notes (Signed)
Pt reports injuring his ankle this afternoon at 1330. States I rolled it and the side of my ankle touched the floor." Reports inability to move toes, feel toes. States they feel numb. Already iced and used compression, unaboot/crutches. CMS intact.

## 2017-06-18 NOTE — ED Notes (Signed)
Pt transported to xray 

## 2017-06-18 NOTE — ED Notes (Signed)
Patient brought to room from X-ray

## 2017-06-19 MED ORDER — MELOXICAM 15 MG PO TABS: 15.0000 mg | ORAL_TABLET | Freq: Every day | ORAL | 0 refills | Status: DC

## 2017-06-19 NOTE — Discharge Instructions (Signed)
Ice and elevate your foot. Mobic for pain and inflammation. Keep boot on. Crutches as needed. Follow up with orthopedics if not improving.
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
## 2018-04-28 ENCOUNTER — Emergency Department (HOSPITAL_COMMUNITY): Payer: Self-pay

## 2018-04-28 ENCOUNTER — Emergency Department (HOSPITAL_COMMUNITY)
Admission: EM | Admit: 2018-04-28 | Discharge: 2018-04-28 | Disposition: A | Discharge status: Home / Self Care | Payer: Self-pay | Attending: Emergency Medicine | Admitting: Emergency Medicine

## 2018-04-28 ENCOUNTER — Encounter (HOSPITAL_COMMUNITY): Payer: Self-pay | Admitting: *Deleted

## 2018-04-28 DIAGNOSIS — Y9239 Other specified sports and athletic area as the place of occurrence of the external cause: Secondary | ICD-10-CM | POA: Insufficient documentation

## 2018-04-28 DIAGNOSIS — Y9371 Activity, boxing: Secondary | ICD-10-CM | POA: Insufficient documentation

## 2018-04-28 DIAGNOSIS — R202 Paresthesia of skin: Secondary | ICD-10-CM | POA: Insufficient documentation

## 2018-04-28 DIAGNOSIS — W51XXXA Accidental striking against or bumped into by another person, initial encounter: Secondary | ICD-10-CM | POA: Insufficient documentation

## 2018-04-28 DIAGNOSIS — S6991XA Unspecified injury of right wrist, hand and finger(s), initial encounter: Secondary | ICD-10-CM | POA: Insufficient documentation

## 2018-04-28 DIAGNOSIS — Y998 Other external cause status: Secondary | ICD-10-CM | POA: Insufficient documentation

## 2018-04-28 DIAGNOSIS — F17210 Nicotine dependence, cigarettes, uncomplicated: Secondary | ICD-10-CM | POA: Insufficient documentation

## 2018-04-28 MED ORDER — KETOROLAC TROMETHAMINE 60 MG/2ML IM SOLN
30.0000 mg | Freq: Once | INTRAMUSCULAR | Status: AC
  Administered 2018-04-28: 30 mg via INTRAMUSCULAR
  Filled 2018-04-28: qty 2

## 2018-04-28 NOTE — Discharge Instructions (Signed)
You may take 600 mg of ibuprofen every 6 hours as needed for pain.  Wear the splint at all times except when showering.  Apply ice for comfort.  Follow-up with the hand surgeon for reevaluation and repeat x-rays in 1 to 2 weeks.  Your x-rays today were normal but there is a possibility of a missed fracture.  Return to the emergency department if any concerning signs or symptoms develop such as fevers, worsening swelling, weakness, or loss of pulses/feeling in the hand
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
## 2018-04-28 NOTE — ED Triage Notes (Signed)
Pt complains of right wrist and hand pain, swelling, limited ROM since boxing injury yesterday.

## 2019-07-30 ENCOUNTER — Emergency Department (HOSPITAL_COMMUNITY): Payer: Self-pay

## 2019-07-30 ENCOUNTER — Emergency Department (HOSPITAL_COMMUNITY)
Admission: EM | Admit: 2019-07-30 | Discharge: 2019-07-30 | Disposition: A | Discharge status: Home / Self Care | Payer: Self-pay | Attending: Emergency Medicine | Admitting: Emergency Medicine

## 2019-07-30 ENCOUNTER — Encounter (HOSPITAL_COMMUNITY): Payer: Self-pay | Admitting: Emergency Medicine

## 2019-07-30 ENCOUNTER — Other Ambulatory Visit: Payer: Self-pay

## 2019-07-30 DIAGNOSIS — R079 Chest pain, unspecified: Secondary | ICD-10-CM | POA: Insufficient documentation

## 2019-07-30 DIAGNOSIS — M7918 Myalgia, other site: Secondary | ICD-10-CM | POA: Insufficient documentation

## 2019-07-30 DIAGNOSIS — F17210 Nicotine dependence, cigarettes, uncomplicated: Secondary | ICD-10-CM | POA: Insufficient documentation

## 2019-07-30 DIAGNOSIS — Z20828 Contact with and (suspected) exposure to other viral communicable diseases: Secondary | ICD-10-CM | POA: Insufficient documentation

## 2019-07-30 DIAGNOSIS — N342 Other urethritis: Secondary | ICD-10-CM | POA: Insufficient documentation

## 2019-07-30 DIAGNOSIS — R52 Pain, unspecified: Secondary | ICD-10-CM

## 2019-07-30 LAB — URINALYSIS, ROUTINE W REFLEX MICROSCOPIC
Bacteria, UA: NONE SEEN
Bilirubin Urine: NEGATIVE
Glucose, UA: NEGATIVE mg/dL
Hgb urine dipstick: NEGATIVE
Ketones, ur: NEGATIVE mg/dL
Nitrite: NEGATIVE
Protein, ur: NEGATIVE mg/dL
Specific Gravity, Urine: 1.031 — ABNORMAL HIGH (ref 1.005–1.030)
WBC, UA: >50 WBC/hpf — ABNORMAL HIGH (ref 0–5)
pH: 5 (ref 5.0–8.0)

## 2019-07-30 LAB — COMPREHENSIVE METABOLIC PANEL
ALT: 22 U/L (ref 0–44)
AST: 20 U/L (ref 15–41)
Albumin: 4.6 g/dL (ref 3.5–5.0)
Alkaline Phosphatase: 111 U/L (ref 38–126)
Anion gap: 9 (ref 5–15)
BUN: 23 mg/dL — ABNORMAL HIGH (ref 6–20)
CO2: 23 mmol/L (ref 22–32)
Calcium: 9.4 mg/dL (ref 8.9–10.3)
Chloride: 106 mmol/L (ref 98–111)
Creatinine, Ser: 1.1 mg/dL (ref 0.61–1.24)
GFR calc Af Amer: >60 mL/min (ref >60)
GFR calc non Af Amer: >60 mL/min (ref >60)
Glucose, Bld: 106 mg/dL — ABNORMAL HIGH (ref 70–99)
Potassium: 3.6 mmol/L (ref 3.5–5.1)
Sodium: 138 mmol/L (ref 135–145)
Total Bilirubin: 0.4 mg/dL (ref 0.3–1.2)
Total Protein: 7.1 g/dL (ref 6.5–8.1)

## 2019-07-30 LAB — CK: Total CK: 253 U/L (ref 49–397)

## 2019-07-30 LAB — CBC
HCT: 45.9 % (ref 39.0–52.0)
Hemoglobin: 15.5 g/dL (ref 13.0–17.0)
MCH: 31.8 pg (ref 26.0–34.0)
MCHC: 33.8 g/dL (ref 30.0–36.0)
MCV: 94.1 fL (ref 80.0–100.0)
Platelets: 212 10*3/uL (ref 150–400)
RBC: 4.88 MIL/uL (ref 4.22–5.81)
RDW: 12.3 % (ref 11.5–15.5)
WBC: 6.7 10*3/uL (ref 4.0–10.5)
nRBC: 0 % (ref 0.0–0.2)

## 2019-07-30 LAB — TROPONIN I (HIGH SENSITIVITY): Troponin I (High Sensitivity): 3 ng/L (ref <18)

## 2019-07-30 MED ORDER — CEFTRIAXONE SODIUM 250 MG IJ SOLR
250.0000 mg | Freq: Once | INTRAMUSCULAR | Status: AC
  Administered 2019-07-30: 250 mg via INTRAMUSCULAR
  Filled 2019-07-30: qty 250

## 2019-07-30 MED ORDER — AZITHROMYCIN 250 MG PO TABS
1000.0000 mg | ORAL_TABLET | Freq: Once | ORAL | Status: AC
  Administered 2019-07-30: 19:00:00 1000 mg via ORAL
  Filled 2019-07-30: qty 4

## 2019-07-30 MED ORDER — LIDOCAINE HCL 1 % IJ SOLN
INTRAMUSCULAR | Status: AC
  Administered 2019-07-30: 19:00:00 0.9 mL
  Filled 2019-07-30: qty 20
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
## 2019-07-30 NOTE — Discharge Instructions (Addendum)
You were evaluated in the Emergency Department and after careful evaluation, we did not find any emergent condition requiring admission or further testing in the hospital.  Your exam/testing today was overall reassuring.  We treated you in the emergency department for urethritis.  You will receive a call with any abnormal test results.  Please return to the Emergency Department if you experience any worsening of your condition.  We encourage you to follow up with a primary care provider.  Thank you for allowing [place] to be a part of your care.

## 2019-07-30 NOTE — ED Triage Notes (Signed)
Pt from home. Pt states he has had chest pains for the last week. Pt states that for the past few days he has been very tired and had pain in his lungs. Pt states that he has pain in his abdomen and low back.

## 2019-07-31 LAB — SARS CORONAVIRUS 2 (TAT 6-24 HRS): SARS Coronavirus 2: NEGATIVE

## 2020-03-18 ENCOUNTER — Encounter (HOSPITAL_COMMUNITY): Payer: Self-pay | Admitting: *Deleted

## 2020-03-18 ENCOUNTER — Emergency Department (HOSPITAL_COMMUNITY)
Admission: EM | Admit: 2020-03-18 | Discharge: 2020-03-18 | Disposition: A | Discharge status: Home / Self Care | Payer: Self-pay | Attending: Emergency Medicine | Admitting: Emergency Medicine

## 2020-03-18 ENCOUNTER — Emergency Department (HOSPITAL_COMMUNITY): Payer: Self-pay

## 2020-03-18 ENCOUNTER — Other Ambulatory Visit: Payer: Self-pay

## 2020-03-18 DIAGNOSIS — N390 Urinary tract infection, site not specified: Secondary | ICD-10-CM | POA: Insufficient documentation

## 2020-03-18 DIAGNOSIS — F17210 Nicotine dependence, cigarettes, uncomplicated: Secondary | ICD-10-CM | POA: Insufficient documentation

## 2020-03-18 DIAGNOSIS — R1032 Left lower quadrant pain: Secondary | ICD-10-CM

## 2020-03-18 DIAGNOSIS — N50812 Left testicular pain: Secondary | ICD-10-CM | POA: Insufficient documentation

## 2020-03-18 DIAGNOSIS — Z85841 Personal history of malignant neoplasm of brain: Secondary | ICD-10-CM | POA: Insufficient documentation

## 2020-03-18 DIAGNOSIS — R52 Pain, unspecified: Secondary | ICD-10-CM

## 2020-03-18 DIAGNOSIS — N5082 Scrotal pain: Secondary | ICD-10-CM | POA: Insufficient documentation

## 2020-03-18 LAB — URINALYSIS, ROUTINE W REFLEX MICROSCOPIC
Bilirubin Urine: NEGATIVE
Glucose, UA: NEGATIVE mg/dL
Hgb urine dipstick: NEGATIVE
Ketones, ur: NEGATIVE mg/dL
Nitrite: NEGATIVE
Protein, ur: NEGATIVE mg/dL
Specific Gravity, Urine: 1.024 (ref 1.005–1.030)
pH: 6 (ref 5.0–8.0)

## 2020-03-18 MED ORDER — IBUPROFEN 800 MG PO TABS: 800.0000 mg | ORAL_TABLET | Freq: Three times a day (TID) | ORAL | 0 refills | Status: DC | PRN

## 2020-03-18 MED ORDER — DOXYCYCLINE HYCLATE 100 MG PO CAPS: 100.0000 mg | ORAL_CAPSULE | Freq: Two times a day (BID) | ORAL | 0 refills | Status: DC

## 2020-03-18 NOTE — Discharge Instructions (Addendum)
You need to have a follow up ultrasound in 3 months.  The radiologist reports you have a 15mm area that could be an early mass.

## 2020-03-18 NOTE — ED Notes (Signed)
Urine culture sent with UA to lab

## 2020-03-18 NOTE — ED Triage Notes (Signed)
Pt states he slipped and fell last week left groin pain due to leg split position upon falling. Sharpe pain in abd that has increased, this morning noticed blood in urine.
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
## 2020-03-19 LAB — GC/CHLAMYDIA PROBE AMP (~~LOC~~) NOT AT ARMC
Chlamydia: NEGATIVE
Comment: NEGATIVE
Comment: NORMAL
Neisseria Gonorrhea: NEGATIVE

## 2021-03-19 ENCOUNTER — Encounter (HOSPITAL_COMMUNITY): Payer: Self-pay | Admitting: *Deleted

## 2021-03-19 ENCOUNTER — Other Ambulatory Visit: Payer: Self-pay

## 2021-03-19 ENCOUNTER — Emergency Department (HOSPITAL_COMMUNITY): Payer: Self-pay

## 2021-03-19 ENCOUNTER — Emergency Department (HOSPITAL_COMMUNITY)
Admission: EM | Admit: 2021-03-19 | Discharge: 2021-03-19 | Disposition: A | Discharge status: Home / Self Care | Payer: Self-pay | Attending: Emergency Medicine | Admitting: Emergency Medicine

## 2021-03-19 DIAGNOSIS — F17210 Nicotine dependence, cigarettes, uncomplicated: Secondary | ICD-10-CM | POA: Insufficient documentation

## 2021-03-19 DIAGNOSIS — R112 Nausea with vomiting, unspecified: Secondary | ICD-10-CM | POA: Insufficient documentation

## 2021-03-19 DIAGNOSIS — R1084 Generalized abdominal pain: Secondary | ICD-10-CM | POA: Insufficient documentation

## 2021-03-19 LAB — CBC WITH DIFFERENTIAL/PLATELET
Abs Immature Granulocytes: 0.01 10*3/uL (ref 0.00–0.07)
Basophils Absolute: 0 10*3/uL (ref 0.0–0.1)
Basophils Relative: 1 %
Eosinophils Absolute: 0.2 10*3/uL (ref 0.0–0.5)
Eosinophils Relative: 6 %
HCT: 46.4 % (ref 39.0–52.0)
Hemoglobin: 16.1 g/dL (ref 13.0–17.0)
Immature Granulocytes: 0 %
Lymphocytes Relative: 49 %
Lymphs Abs: 2.1 10*3/uL (ref 0.7–4.0)
MCH: 32.6 pg (ref 26.0–34.0)
MCHC: 34.7 g/dL (ref 30.0–36.0)
MCV: 93.9 fL (ref 80.0–100.0)
Monocytes Absolute: 0.4 10*3/uL (ref 0.1–1.0)
Monocytes Relative: 9 %
Neutro Abs: 1.5 10*3/uL — ABNORMAL LOW (ref 1.7–7.7)
Neutrophils Relative %: 35 %
Platelets: 205 10*3/uL (ref 150–400)
RBC: 4.94 MIL/uL (ref 4.22–5.81)
RDW: 12.2 % (ref 11.5–15.5)
WBC: 4.2 10*3/uL (ref 4.0–10.5)
nRBC: 0 % (ref 0.0–0.2)

## 2021-03-19 LAB — COMPREHENSIVE METABOLIC PANEL
ALT: 14 U/L (ref 0–44)
AST: 17 U/L (ref 15–41)
Albumin: 4.5 g/dL (ref 3.5–5.0)
Alkaline Phosphatase: 103 U/L (ref 38–126)
Anion gap: 8 (ref 5–15)
BUN: 17 mg/dL (ref 6–20)
CO2: 25 mmol/L (ref 22–32)
Calcium: 9 mg/dL (ref 8.9–10.3)
Chloride: 106 mmol/L (ref 98–111)
Creatinine, Ser: 1.08 mg/dL (ref 0.61–1.24)
GFR, Estimated: >60 mL/min (ref >60)
Glucose, Bld: 88 mg/dL (ref 70–99)
Potassium: 3.9 mmol/L (ref 3.5–5.1)
Sodium: 139 mmol/L (ref 135–145)
Total Bilirubin: 0.7 mg/dL (ref 0.3–1.2)
Total Protein: 7.2 g/dL (ref 6.5–8.1)

## 2021-03-19 LAB — URINALYSIS, ROUTINE W REFLEX MICROSCOPIC
Bilirubin Urine: NEGATIVE
Glucose, UA: NEGATIVE mg/dL
Hgb urine dipstick: NEGATIVE
Ketones, ur: 5 mg/dL — AB
Leukocytes,Ua: NEGATIVE
Nitrite: NEGATIVE
Protein, ur: NEGATIVE mg/dL
Specific Gravity, Urine: 1.024 (ref 1.005–1.030)
pH: 5 (ref 5.0–8.0)

## 2021-03-19 LAB — LIPASE, BLOOD: Lipase: 47 U/L (ref 11–51)

## 2021-03-19 MED ORDER — IOHEXOL 9 MG/ML PO SOLN
500.0000 mL | ORAL | Status: AC
  Administered 2021-03-19 (×2): 500 mL via ORAL

## 2021-03-19 MED ORDER — METHOCARBAMOL 500 MG PO TABS
500.0000 mg | ORAL_TABLET | Freq: Once | ORAL | Status: AC
  Administered 2021-03-19: 500 mg via ORAL
  Filled 2021-03-19: qty 1

## 2021-03-19 MED ORDER — SODIUM CHLORIDE 0.9 % IV BOLUS
500.0000 mL | Freq: Once | INTRAVENOUS | Status: AC
  Administered 2021-03-19: 500 mL via INTRAVENOUS

## 2021-03-19 MED ORDER — OXYCODONE HCL 5 MG PO TABS: 5.0000 mg | ORAL_TABLET | Freq: Four times a day (QID) | ORAL | 0 refills | Status: DC | PRN

## 2021-03-19 MED ORDER — FENTANYL CITRATE (PF) 100 MCG/2ML IJ SOLN
50.0000 ug | Freq: Once | INTRAMUSCULAR | Status: AC
  Administered 2021-03-19: 50 ug via INTRAVENOUS
  Filled 2021-03-19: qty 2

## 2021-03-19 MED ORDER — KETOROLAC TROMETHAMINE 15 MG/ML IJ SOLN
15.0000 mg | Freq: Once | INTRAMUSCULAR | Status: AC
  Administered 2021-03-19: 15 mg via INTRAVENOUS
  Filled 2021-03-19: qty 1

## 2021-03-19 MED ORDER — IOHEXOL 9 MG/ML PO SOLN
ORAL | Status: AC
  Administered 2021-03-19: 1000 mL
  Filled 2021-03-19: qty 1000

## 2021-03-19 MED ORDER — METHOCARBAMOL 500 MG PO TABS: 500.0000 mg | ORAL_TABLET | Freq: Four times a day (QID) | ORAL | 0 refills | Status: DC

## 2021-03-19 MED ORDER — HYDROMORPHONE HCL 1 MG/ML IJ SOLN
0.5000 mg | Freq: Once | INTRAMUSCULAR | Status: AC
  Administered 2021-03-19: 0.5 mg via INTRAVENOUS
  Filled 2021-03-19: qty 1

## 2021-03-19 MED ORDER — ONDANSETRON HCL 4 MG/2ML IJ SOLN
4.0000 mg | Freq: Once | INTRAMUSCULAR | Status: AC
  Administered 2021-03-19: 4 mg via INTRAVENOUS
  Filled 2021-03-19: qty 2

## 2021-03-19 MED ORDER — OXYCODONE HCL 5 MG PO TABS
5.0000 mg | ORAL_TABLET | Freq: Once | ORAL | Status: AC
  Administered 2021-03-19: 5 mg via ORAL
  Filled 2021-03-19: qty 1

## 2021-03-19 MED ORDER — NAPROXEN 500 MG PO TABS: 500.0000 mg | ORAL_TABLET | Freq: Two times a day (BID) | ORAL | 0 refills | Status: DC

## 2021-03-19 NOTE — ED Triage Notes (Signed)
Pt complains of abdominal pain since last night. 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 Reports constipation x 3 days.
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
## 2021-03-19 NOTE — Discharge Instructions (Signed)
Please read and follow all provided instructions.  Your diagnoses today include:  1. Generalized abdominal pain     Tests performed today include:  Blood cell counts and platelets  Kidney and liver function tests  Pancreas function test (called lipase)  Urine test to look for infection  CT scan - shows a small stable right upper quadrant hernia above where your worst pain is, otherwise everything internally looks healthy  Vital signs. See below for your results today.   Medications prescribed:   Oxycodone - narcotic pain medication  DO NOT drive or perform any activities that require you to be awake and alert because this medicine can make you drowsy.    Robaxin (methocarbamol) - muscle relaxer medication  DO NOT drive or perform any activities that require you to be awake and alert because this medicine can make you drowsy.    Naproxen - anti-inflammatory pain medication  Do not exceed 500mg  naproxen every 12 hours, take with food  You have been prescribed an anti-inflammatory medication or NSAID. Take with food. Take smallest effective dose for the shortest duration needed for your pain. Stop taking if you experience stomach pain or vomiting.   Take any prescribed medications only as directed.  Home care instructions:   Follow any educational materials contained in this packet.  Follow-up instructions: Please follow-up with your primary care provider in the next 3 days for further evaluation of your symptoms.    Return instructions:  SEEK IMMEDIATE MEDICAL ATTENTION IF:  The pain does not go away or becomes severe   A temperature above 101F develops   Repeated vomiting occurs (multiple episodes)   The pain becomes localized to portions of the abdomen. The right side could possibly be appendicitis. In an adult, the left lower portion of the abdomen could be colitis or diverticulitis.   Blood is being passed in stools or vomit (bright red or black tarry  stools)   You develop chest pain, difficulty breathing, dizziness or fainting, or become confused, poorly responsive, or inconsolable (young children)  If you have any other emergent concerns regarding your health  Additional Information: Abdominal (belly) pain can be caused by many things. Your caregiver performed an examination and possibly ordered blood/urine tests and imaging (CT scan, x-rays, ultrasound). Many cases can be observed and treated at home after initial evaluation in the emergency department. Even though you are being discharged home, abdominal pain can be unpredictable. Therefore, you need a repeated exam if your pain does not resolve, returns, or worsens. Most patients with abdominal pain don't have to be admitted to the hospital or have surgery, but serious problems like appendicitis and gallbladder attacks can start out as nonspecific pain. Many abdominal conditions cannot be diagnosed in one visit, so follow-up evaluations are very important.  Your vital signs today were: BP 135/74   Pulse (!) 57   Temp 98.1 F (36.7 C) (Oral)   Resp 18   SpO2 92%  If your blood pressure (bp) was elevated above 135/85 this visit, please have this repeated by your doctor within one month. --------------

## 2021-03-19 NOTE — ED Notes (Signed)
Attempted to have pt drink oral contrast for CT scan for 3 hours. Pt unable to do so.

## 2021-07-30 ENCOUNTER — Encounter (HOSPITAL_COMMUNITY): Payer: Self-pay

## 2021-07-30 ENCOUNTER — Emergency Department (HOSPITAL_COMMUNITY): Payer: Self-pay

## 2021-07-30 ENCOUNTER — Other Ambulatory Visit: Payer: Self-pay

## 2021-07-30 ENCOUNTER — Emergency Department (HOSPITAL_COMMUNITY)
Admission: EM | Admit: 2021-07-30 | Discharge: 2021-07-31 | Disposition: A | Discharge status: Home / Self Care | Payer: Self-pay | Attending: Emergency Medicine | Admitting: Emergency Medicine

## 2021-07-30 DIAGNOSIS — F17210 Nicotine dependence, cigarettes, uncomplicated: Secondary | ICD-10-CM | POA: Insufficient documentation

## 2021-07-30 DIAGNOSIS — W208XXA Other cause of strike by thrown, projected or falling object, initial encounter: Secondary | ICD-10-CM | POA: Insufficient documentation

## 2021-07-30 DIAGNOSIS — M79605 Pain in left leg: Secondary | ICD-10-CM

## 2021-07-30 DIAGNOSIS — S8012XA Contusion of left lower leg, initial encounter: Secondary | ICD-10-CM | POA: Insufficient documentation

## 2021-07-30 NOTE — ED Triage Notes (Signed)
Pt states that he dropped a pallet on his left lower left yesterday around 4 pm.

## 2021-07-30 NOTE — ED Provider Notes (Signed)
Emergency Medicine Provider Triage Evaluation Note  Jorge Henderson , a 40 y.o. male  was evaluated in triage.  Pt complains of patient states that yesterday he dropped a wooden pallet and it struck his left shin.  Reports significant pain in the region.  States it worsens with ambulation.  States he applied ice prior to arrival which improved the edema in the region.  No fevers or vomiting.  Physical Exam  BP 131/77 (BP Location: Left Arm)   Pulse 65   Temp 98.1 F (36.7 C) (Oral)   Resp 15   Ht 5\' 9"  (1.753 m)   Wt 83.9 kg   SpO2 99%   BMI 27.32 kg/m  Gen:   Awake, no distress   Resp:  Normal effort  MSK:   Moves extremities without difficulty  Other:  Moderate TTP along the left inferior shin.  2+ DP pulses.  Medical Decision Making  Medically screening exam initiated at 9:49 PM.  Appropriate orders placed.  GR[place] WANT was informed that the remainder of the evaluation will be completed by another provider, this initial triage assessment does not replace that evaluation, and the importance of remaining in the ED until their evaluation is complete.   Rayna Sexton, PA-C 07/30/21 2150    Luna Fuse, MD 08/13/21 [phone]

## 2021-07-31 NOTE — Discharge Instructions (Addendum)
Your xray did not show any findings of fractures or other abnormalities.  Please use crutches as needed given you are having pain with ambulating.  While at home please rest, ice, and elevate your leg as much as possible. Take Ibuprofen as needed for pain.   If no improvement with pain you can follow up with Dr. Griffin Basil for further eval  Return to the ED for any new/worsening symptoms
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
## 2023-06-01 ENCOUNTER — Emergency Department (HOSPITAL_COMMUNITY): Payer: Medicaid Other

## 2023-06-01 ENCOUNTER — Emergency Department (HOSPITAL_COMMUNITY)
Admission: EM | Admit: 2023-06-01 | Discharge: 2023-06-01 | Disposition: A | Discharge status: Home / Self Care | Payer: Medicaid Other | Attending: Emergency Medicine | Admitting: Emergency Medicine

## 2023-06-01 DIAGNOSIS — N50811 Right testicular pain: Secondary | ICD-10-CM | POA: Diagnosis not present

## 2023-06-01 LAB — URINALYSIS, ROUTINE W REFLEX MICROSCOPIC
Bilirubin Urine: NEGATIVE
Glucose, UA: NEGATIVE mg/dL
Hgb urine dipstick: NEGATIVE
Ketones, ur: NEGATIVE mg/dL
Nitrite: NEGATIVE
Protein, ur: NEGATIVE mg/dL
Specific Gravity, Urine: 1.021 (ref 1.005–1.030)
pH: 7 (ref 5.0–8.0)

## 2023-06-01 MED ORDER — KETOROLAC TROMETHAMINE 30 MG/ML IJ SOLN
30.0000 mg | Freq: Once | INTRAMUSCULAR | Status: AC
  Administered 2023-06-01: 30 mg via INTRAVENOUS
  Filled 2023-06-01: qty 1

## 2023-06-01 NOTE — ED Triage Notes (Signed)
Pt. Stated, Ive had testicle pain for a month. Ive had some swelling and clear discharge.

## 2023-06-01 NOTE — Discharge Instructions (Addendum)
Your urine study was negative for infection. Your ultrasound showed good blood supply to your Right testicle, but nothing to explain your symptoms.  - Recommend calling and making an appointment to see [facility]. They can help with further testing and treatment if needed. - Take ibuprofen or Aleve as needed for your Right testicular pain. - It will take a few days to get back the results of the urine STI tests.
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
## 2024-07-20 ENCOUNTER — Emergency Department (HOSPITAL_COMMUNITY)
Admission: EM | Admit: 2024-07-20 | Discharge: 2024-07-20 | Disposition: A | Discharge status: Home / Self Care | Payer: Self-pay | Attending: Emergency Medicine | Admitting: Emergency Medicine

## 2024-07-20 ENCOUNTER — Encounter (HOSPITAL_COMMUNITY): Payer: Self-pay | Admitting: Emergency Medicine

## 2024-07-20 ENCOUNTER — Emergency Department (HOSPITAL_COMMUNITY): Payer: Self-pay

## 2024-07-20 ENCOUNTER — Other Ambulatory Visit: Payer: Self-pay

## 2024-07-20 DIAGNOSIS — J40 Bronchitis, not specified as acute or chronic: Secondary | ICD-10-CM | POA: Insufficient documentation

## 2024-07-20 LAB — RESP PANEL BY RT-PCR (RSV, FLU A&B, COVID)  RVPGX2
Influenza A by PCR: NEGATIVE
Influenza B by PCR: NEGATIVE
Resp Syncytial Virus by PCR: NEGATIVE
SARS Coronavirus 2 by RT PCR: NEGATIVE

## 2024-07-20 MED ORDER — ALBUTEROL SULFATE HFA 108 (90 BASE) MCG/ACT IN AERS
2.0000 | INHALATION_SPRAY | Freq: Once | RESPIRATORY_TRACT | Status: AC
  Administered 2024-07-20: 2 via RESPIRATORY_TRACT
  Filled 2024-07-20: qty 6.7
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
## 2024-07-20 NOTE — ED Notes (Signed)
 Patient transported to X-ray

## 2024-07-20 NOTE — Discharge Instructions (Addendum)
 You may use over-the-counter ibuprofen  as well as cough medicine to help with your symptoms.  Be sure to drink plenty of fluids.  You may use the albuterol  inhaler 1-2 puffs every 4 hours as needed for cough, shortness of breath, wheezing.  It is important to stop smoking as well.  If you develop new or worsening shortness of breath, coughing up blood, chest pain, or any other new/concerning symptoms then return to the ER.

## 2024-07-20 NOTE — ED Triage Notes (Signed)
 PT complains of headache, fever and body aches x 3 days. Fever was 100 this morning and took ibuprofen  this morning. Pt concerned with possible Covid or Flu.

## 2024-07-24 ENCOUNTER — Encounter (HOSPITAL_COMMUNITY): Payer: Self-pay

## 2024-07-24 ENCOUNTER — Emergency Department (HOSPITAL_COMMUNITY): Payer: Self-pay

## 2024-07-24 ENCOUNTER — Emergency Department (HOSPITAL_COMMUNITY)
Admission: EM | Admit: 2024-07-24 | Discharge: 2024-07-25 | Disposition: A | Discharge status: Home / Self Care | Payer: Self-pay | Attending: Emergency Medicine | Admitting: Emergency Medicine

## 2024-07-24 ENCOUNTER — Other Ambulatory Visit: Payer: Self-pay

## 2024-07-24 DIAGNOSIS — J209 Acute bronchitis, unspecified: Secondary | ICD-10-CM | POA: Insufficient documentation

## 2024-07-24 DIAGNOSIS — J189 Pneumonia, unspecified organism: Secondary | ICD-10-CM | POA: Insufficient documentation

## 2024-07-24 DIAGNOSIS — F17210 Nicotine dependence, cigarettes, uncomplicated: Secondary | ICD-10-CM | POA: Insufficient documentation

## 2024-07-24 NOTE — ED Triage Notes (Signed)
  PT arrives to ED with complaint of Bronchitis x4 days, PT states he was given an inhaler and its not helping. PT has had a cough and PT states he is coughing up dark yellow sputum and states he isnt getting any better. VSS, PT walked in under own power.

## 2024-07-24 NOTE — ED Provider Triage Note (Signed)
 Emergency Medicine Provider Triage Evaluation Note  Jorge [place]. , a 43 y.o. male  was evaluated in triage.  Pt complains of chest pain, cough, shortness of breath.  Patient was seen in the emergency department 4 days ago, diagnosed with bronchitis.  He had negative COVID test, negative chest x-ray.  Provided with albuterol  inhaler, but states that this has not been helping.  States that fevers have improved.  Presents for reevaluation.  No recent travel, leg swelling, surgeries, history of blood clot.  Review of Systems  Positive: Cough, chest pain, shortness of breath Negative: Fever  Physical Exam  BP 138/72   Pulse 78   Temp 98.1 F (36.7 C)   Resp 18   Ht 5' 9 (1.753 m)   Wt 88.5 kg   SpO2 99%   BMI 28.80 kg/m  Gen:   Awake, no distress   Resp:  Normal effort  MSK:   Moves extremities without difficulty  Other:  Lungs clear.  Medical Decision Making  Medically screening exam initiated at 8:11 PM.  Appropriate orders placed.  Jorge [place]. was informed that the remainder of the evaluation will be completed by another provider, this initial triage assessment does not replace that evaluation, and the importance of remaining in the ED until their evaluation is complete.     Desiderio Chew, PA-C 07/24/24 2013

## 2024-07-24 NOTE — ED Triage Notes (Signed)
 Pt presents stating he was diagnosed with Bronchitis x 4 days ago, reports albuterol  inhaler not helping, pt states pain when coughing and dizziness

## 2024-07-25 MED ORDER — DEXAMETHASONE 10 MG/ML FOR PEDIATRIC ORAL USE
10.0000 mg | Freq: Once | INTRAMUSCULAR | Status: AC
  Administered 2024-07-25: 10 mg via ORAL
  Filled 2024-07-25: qty 1

## 2024-07-25 MED ORDER — AZITHROMYCIN 250 MG PO TABS: ORAL_TABLET | ORAL | 0 refills | Status: DC

## 2024-07-25 NOTE — Discharge Instructions (Addendum)
  The steroid dose we gave you will last approximately 3 days.  Take antibiotics for concern for atypical pneumonia.  Continue Tylenol  and Motrin  as needed for pain and fevers.  Stay well-hydrated.  Return for breathing difficulty or new concerns.
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
## 2024-09-10 ENCOUNTER — Emergency Department (HOSPITAL_COMMUNITY): Payer: Self-pay

## 2024-09-10 ENCOUNTER — Other Ambulatory Visit: Payer: Self-pay

## 2024-09-10 ENCOUNTER — Emergency Department (HOSPITAL_COMMUNITY)
Admission: EM | Admit: 2024-09-10 | Discharge: 2024-09-10 | Disposition: A | Discharge status: Home / Self Care | Payer: Self-pay | Attending: Emergency Medicine | Admitting: Emergency Medicine

## 2024-09-10 DIAGNOSIS — K625 Hemorrhage of anus and rectum: Secondary | ICD-10-CM | POA: Insufficient documentation

## 2024-09-10 LAB — CBC
HCT: 46 % (ref 39.0–52.0)
Hemoglobin: 16.1 g/dL (ref 13.0–17.0)
MCH: 32.3 pg (ref 26.0–34.0)
MCHC: 35 g/dL (ref 30.0–36.0)
MCV: 92.2 fL (ref 80.0–100.0)
Platelets: 211 K/uL (ref 150–400)
RBC: 4.99 MIL/uL (ref 4.22–5.81)
RDW: 12.4 % (ref 11.5–15.5)
WBC: 6.5 K/uL (ref 4.0–10.5)
nRBC: 0 % (ref 0.0–0.2)

## 2024-09-10 LAB — TYPE AND SCREEN
ABO/RH(D): B POS
Antibody Screen: NEGATIVE

## 2024-09-10 LAB — COMPREHENSIVE METABOLIC PANEL WITH GFR
ALT: 20 U/L (ref 0–44)
AST: 22 U/L (ref 15–41)
Albumin: 4.7 g/dL (ref 3.5–5.0)
Alkaline Phosphatase: 132 U/L — ABNORMAL HIGH (ref 38–126)
Anion gap: 10 (ref 5–15)
BUN: 17 mg/dL (ref 6–20)
CO2: 25 mmol/L (ref 22–32)
Calcium: 9.6 mg/dL (ref 8.9–10.3)
Chloride: 106 mmol/L (ref 98–111)
Creatinine, Ser: 1.04 mg/dL (ref 0.61–1.24)
GFR, Estimated: >60 mL/min (ref >60)
Glucose, Bld: 95 mg/dL (ref 70–99)
Potassium: 4.2 mmol/L (ref 3.5–5.1)
Sodium: 140 mmol/L (ref 135–145)
Total Bilirubin: 0.5 mg/dL (ref 0.0–1.2)
Total Protein: 7.4 g/dL (ref 6.5–8.1)

## 2024-09-10 LAB — POC OCCULT BLOOD, ED: Fecal Occult Bld: POSITIVE — AB

## 2024-09-10 MED ORDER — IOHEXOL 300 MG/ML  SOLN
100.0000 mL | Freq: Once | INTRAMUSCULAR | Status: AC | PRN
  Administered 2024-09-10: 100 mL via INTRAVENOUS

## 2024-09-10 MED ORDER — SODIUM CHLORIDE 0.9 % IV BOLUS
500.0000 mL | Freq: Once | INTRAVENOUS | Status: AC
  Administered 2024-09-10: 500 mL via INTRAVENOUS

## 2024-09-10 MED ORDER — MORPHINE SULFATE (PF) 4 MG/ML IV SOLN
4.0000 mg | Freq: Once | INTRAVENOUS | Status: AC
  Administered 2024-09-10: 4 mg via INTRAVENOUS
  Filled 2024-09-10: qty 1

## 2024-09-10 MED ORDER — ONDANSETRON HCL 4 MG/2ML IJ SOLN
4.0000 mg | Freq: Once | INTRAMUSCULAR | Status: AC
  Administered 2024-09-10: 4 mg via INTRAVENOUS
  Filled 2024-09-10: qty 2
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
## 2024-09-10 NOTE — Discharge Instructions (Signed)
 Thank for letting us  evaluate you today.  Your blood counts are normal.  You do not appear to have some bleeding on rectal exam.  Your CT imaging does not show any acute abnormalities or etiology of the rectal bleeding.  We have provided you with GI/belly doctor follow-up for further management of this.  Please follow-up with them.  Return to Emergency Department if experience intractable vomiting, worsening symptoms
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
